# Patient Record
Sex: Female | Born: 1985 | Race: Black or African American | Hispanic: No | Marital: Single | Smoking: Former smoker
Health system: Southern US, Community
[De-identification: ages and names within clinical notes are randomized; demographics above are authoritative.]

## PROBLEM LIST (undated history)

## (undated) DIAGNOSIS — G43909 Migraine, unspecified, not intractable, without status migrainosus: Secondary | ICD-10-CM

---
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
## 2015-08-27 ENCOUNTER — Emergency Department (HOSPITAL_BASED_OUTPATIENT_CLINIC_OR_DEPARTMENT_OTHER): Payer: No Typology Code available for payment source

## 2015-08-27 ENCOUNTER — Encounter (HOSPITAL_BASED_OUTPATIENT_CLINIC_OR_DEPARTMENT_OTHER): Payer: Self-pay | Admitting: Emergency Medicine

## 2015-08-27 ENCOUNTER — Emergency Department (HOSPITAL_BASED_OUTPATIENT_CLINIC_OR_DEPARTMENT_OTHER)
Admission: EM | Admit: 2015-08-27 | Discharge: 2015-08-27 | Disposition: A | Discharge status: Home / Self Care | Payer: No Typology Code available for payment source | Attending: Emergency Medicine | Admitting: Emergency Medicine

## 2015-08-27 DIAGNOSIS — T07XXXA Unspecified multiple injuries, initial encounter: Secondary | ICD-10-CM

## 2015-08-27 DIAGNOSIS — Y92410 Unspecified street and highway as the place of occurrence of the external cause: Secondary | ICD-10-CM | POA: Insufficient documentation

## 2015-08-27 DIAGNOSIS — S2001XA Contusion of right breast, initial encounter: Secondary | ICD-10-CM | POA: Diagnosis not present

## 2015-08-27 DIAGNOSIS — S4991XA Unspecified injury of right shoulder and upper arm, initial encounter: Secondary | ICD-10-CM | POA: Insufficient documentation

## 2015-08-27 DIAGNOSIS — S299XXA Unspecified injury of thorax, initial encounter: Secondary | ICD-10-CM | POA: Insufficient documentation

## 2015-08-27 DIAGNOSIS — Y998 Other external cause status: Secondary | ICD-10-CM | POA: Insufficient documentation

## 2015-08-27 DIAGNOSIS — Z720 Tobacco use: Secondary | ICD-10-CM | POA: Insufficient documentation

## 2015-08-27 DIAGNOSIS — S301XXA Contusion of abdominal wall, initial encounter: Secondary | ICD-10-CM | POA: Insufficient documentation

## 2015-08-27 DIAGNOSIS — Z8679 Personal history of other diseases of the circulatory system: Secondary | ICD-10-CM | POA: Insufficient documentation

## 2015-08-27 DIAGNOSIS — S8001XA Contusion of right knee, initial encounter: Secondary | ICD-10-CM | POA: Insufficient documentation

## 2015-08-27 DIAGNOSIS — S4992XA Unspecified injury of left shoulder and upper arm, initial encounter: Secondary | ICD-10-CM | POA: Diagnosis not present

## 2015-08-27 DIAGNOSIS — S8002XA Contusion of left knee, initial encounter: Secondary | ICD-10-CM | POA: Diagnosis not present

## 2015-08-27 DIAGNOSIS — Y9389 Activity, other specified: Secondary | ICD-10-CM | POA: Diagnosis not present

## 2015-08-27 DIAGNOSIS — S3991XA Unspecified injury of abdomen, initial encounter: Secondary | ICD-10-CM | POA: Diagnosis present

## 2015-08-27 DIAGNOSIS — Z3202 Encounter for pregnancy test, result negative: Secondary | ICD-10-CM | POA: Insufficient documentation

## 2015-08-27 HISTORY — DX: Migraine, unspecified, not intractable, without status migrainosus: G43.909

## 2015-08-27 LAB — URINALYSIS, ROUTINE W REFLEX MICROSCOPIC
Bilirubin Urine: NEGATIVE
Glucose, UA: NEGATIVE mg/dL
Ketones, ur: NEGATIVE mg/dL
Leukocytes, UA: NEGATIVE
Nitrite: NEGATIVE
Protein, ur: NEGATIVE mg/dL
Specific Gravity, Urine: 1.007 (ref 1.005–1.030)
Urobilinogen, UA: 0.2 mg/dL (ref 0.0–1.0)
pH: 7 (ref 5.0–8.0)

## 2015-08-27 LAB — BASIC METABOLIC PANEL
Anion gap: 8 (ref 5–15)
BUN: 6 mg/dL (ref 6–20)
CO2: 27 mmol/L (ref 22–32)
Calcium: 9.4 mg/dL (ref 8.9–10.3)
Chloride: 105 mmol/L (ref 101–111)
Creatinine, Ser: 0.67 mg/dL (ref 0.44–1.00)
GFR calc Af Amer: >60 mL/min (ref >60)
GFR calc non Af Amer: >60 mL/min (ref >60)
Glucose, Bld: 112 mg/dL — ABNORMAL HIGH (ref 65–99)
Potassium: 3.3 mmol/L — ABNORMAL LOW (ref 3.5–5.1)
Sodium: 140 mmol/L (ref 135–145)

## 2015-08-27 LAB — URINE MICROSCOPIC-ADD ON

## 2015-08-27 LAB — PREGNANCY, URINE: PREG TEST UR: NEGATIVE

## 2015-08-27 MED ORDER — IOHEXOL 300 MG/ML  SOLN
100.0000 mL | Freq: Once | INTRAMUSCULAR | Status: AC | PRN
  Administered 2015-08-27: 100 mL via INTRAVENOUS

## 2015-08-27 MED ORDER — IBUPROFEN 800 MG PO TABS: 800.0000 mg | ORAL_TABLET | Freq: Three times a day (TID) | ORAL | Status: DC
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
## 2015-08-27 NOTE — ED Notes (Signed)
Both knees appear swollen at this time
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
## 2015-08-27 NOTE — ED Notes (Signed)
No changes, alert, NAD, calm, steady gait, dressed self, "ready to go", denies further questions or needs, tolerating PO fluids, VSS, out with sister.
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
## 2015-08-27 NOTE — ED Notes (Signed)
Pt not in room. Pt in b/r. Family remains in room.

## 2015-08-27 NOTE — ED Notes (Signed)
MD at bedside. 
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
## 2015-08-27 NOTE — ED Notes (Signed)
Ginger Ale given to patient. 

## 2015-10-14 ENCOUNTER — Encounter: Payer: Self-pay | Admitting: *Deleted

## 2015-11-09 ENCOUNTER — Encounter: Payer: Self-pay | Admitting: Obstetrics & Gynecology

## 2015-12-01 ENCOUNTER — Encounter: Payer: Self-pay | Admitting: Obstetrics and Gynecology
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
## 2017-02-28 DIAGNOSIS — F419 Anxiety disorder, unspecified: Secondary | ICD-10-CM | POA: Insufficient documentation

## 2017-02-28 DIAGNOSIS — Z87891 Personal history of nicotine dependence: Secondary | ICD-10-CM | POA: Insufficient documentation

## 2017-02-28 NOTE — ED Triage Notes (Signed)
Patient is anxious and unable to sleep. Pt reports she is anxious about her turning 31 soon. Pt has made some lifestyle changes of quitting smoking, quit drinking coffee, starting a regular exercise plan in the last two weeks. Pts checked her BP and it was elevated at home.

## 2017-03-01 ENCOUNTER — Emergency Department (HOSPITAL_COMMUNITY)
Admission: EM | Admit: 2017-03-01 | Discharge: 2017-03-01 | Disposition: A | Discharge status: Home / Self Care | Payer: Self-pay | Attending: Emergency Medicine | Admitting: Emergency Medicine

## 2017-03-01 ENCOUNTER — Encounter (HOSPITAL_COMMUNITY): Payer: Self-pay | Admitting: Family Medicine

## 2017-03-01 DIAGNOSIS — F419 Anxiety disorder, unspecified: Secondary | ICD-10-CM

## 2017-03-01 LAB — I-STAT CHEM 8, ED
BUN: 10 mg/dL (ref 6–20)
CHLORIDE: 102 mmol/L (ref 101–111)
CREATININE: 0.7 mg/dL (ref 0.44–1.00)
Calcium, Ion: 1.23 mmol/L (ref 1.15–1.40)
GLUCOSE: 110 mg/dL — AB (ref 65–99)
HCT: 43 % (ref 36.0–46.0)
Hemoglobin: 14.6 g/dL (ref 12.0–15.0)
POTASSIUM: 3.8 mmol/L (ref 3.5–5.1)
Sodium: 139 mmol/L (ref 135–145)
TCO2: 27 mmol/L (ref 0–100)

## 2017-03-01 LAB — PREGNANCY, URINE: Preg Test, Ur: NEGATIVE

## 2017-03-01 MED ORDER — LORAZEPAM 0.5 MG PO TABS: 0.5000 mg | ORAL_TABLET | Freq: Three times a day (TID) | ORAL | 0 refills | Status: AC | PRN

## 2017-03-01 MED ORDER — LORAZEPAM 1 MG PO TABS
1.0000 mg | ORAL_TABLET | Freq: Once | ORAL | Status: AC
  Administered 2017-03-01: 1 mg via ORAL
  Filled 2017-03-01: qty 1

## 2017-03-01 NOTE — Discharge Instructions (Signed)
Follow up with a primary care provider of your choice for further evaluation and management of likely anxiety. Ativan as directed as and if needed.

## 2017-03-01 NOTE — ED Provider Notes (Signed)
[facility]-EMERGENCY DEPT Provider Note   CSN: [id number] Arrival date & time: 02/28/17  2326   By signing my name below, I, Clarisse GougeXavier Herndon, attest that this documentation has been prepared under the direction and in the presence of Elpidio AnisShari Alexie Lanni, PA-C. Electronically Signed: Clarisse GougeXavier Herndon, Scribe. 03/01/17. 12:43 AM.   History   Chief Complaint Chief Complaint  Patient presents with  . Anxiety   The history is provided by the patient and medical records. No language interpreter was used.    HPI Comments: Paula Howard is a 31 y.o. female who presents to the Emergency Department complaining of anxiety x 1 week. She notes associated difficulty going to sleep and staying sleep, warm sensation disturbing sleep, mild heart palpitations, SOB, fatigue, crying spells and heightened blood pressure. She states her symptoms are episodic and last anywhere from minutes to hours. Pt states she recently made a lot of life changes, including quitting caffeine, marijuana and tobacco. She expresses concern for stress over her upcoming 31th birthday. No Hx of diabetes, HTN or daily medications noted. Pt denies possibility of pregnancy, diaphoresis, nausea, SI/HI, hallucinations or Hx of drug abuse.  Past Medical History:  Diagnosis Date  . Migraines     There are no active problems to display for this patient.   History reviewed. No pertinent surgical history.  OB History    No data available       Home Medications    Prior to Admission medications   Medication Sig Start Date End Date Taking? Authorizing Provider  ibuprofen (ADVIL,MOTRIN) 800 MG tablet Take 1 tablet (800 mg total) by mouth 3 (three) times daily. 08/27/15   Glynn OctaveStephen Rancour, MD    Family History History reviewed. No pertinent family history.  Social History Social History  Substance Use Topics  . Smoking status: Former Smoker    Quit date: 02/20/2017  . Smokeless tobacco: Never Used  . Alcohol use Yes     Allergies     Patient has no known allergies.   Review of Systems Review of Systems  Constitutional: Positive for fatigue.       +warm sensation  Respiratory: Negative for shortness of breath.   Cardiovascular: Positive for palpitations.  Gastrointestinal: Negative for abdominal pain, nausea and vomiting.  Genitourinary: Negative for menstrual problem.  Musculoskeletal: Negative.   Skin: Negative.   Neurological: Negative for weakness and headaches.  Psychiatric/Behavioral: Positive for dysphoric mood and sleep disturbance. Negative for hallucinations and suicidal ideas. The patient is nervous/anxious.      Physical Exam Updated Vital Signs BP (!) 142/104   Pulse (!) 110   Temp 98 F (36.7 C) (Oral)   Resp 19   Ht 5\' 3"  (1.6 m)   Wt 189 lb 2 oz (85.8 kg)   SpO2 100%   BMI 33.50 kg/m   Physical Exam  Constitutional: She is oriented to person, place, and time. She appears well-developed and well-nourished. No distress.  HENT:  Head: Normocephalic and atraumatic.  Eyes: EOM are normal. Pupils are equal, round, and reactive to light.  Neck: Normal range of motion. Neck supple.  Cardiovascular: Normal rate, regular rhythm, normal heart sounds and intact distal pulses.  Exam reveals no gallop and no friction rub.   No murmur heard. Pulmonary/Chest: Effort normal and breath sounds normal. No respiratory distress. She has no wheezes. She has no rales.  Abdominal: Soft. Bowel sounds are normal. She exhibits no distension and no mass. There is no tenderness. There is no guarding.  Musculoskeletal:  She exhibits no edema or tenderness.  Neurological: She is alert and oriented to person, place, and time.  Skin: Skin is warm and dry. She is not diaphoretic.  Psychiatric: She has a normal mood and affect. Her behavior is normal.  Nursing note and vitals reviewed.    ED Treatments / Results  DIAGNOSTIC STUDIES: Oxygen Saturation is 100% on RA, normal by my interpretation.    COORDINATION OF  CARE: 12:39 AM Discussed treatment plan with pt at bedside and pt agreed to plan. Will order blood work and medication.  Labs (all labs ordered are listed, but only abnormal results are displayed) Labs Reviewed  PREGNANCY, URINE    EKG  EKG Interpretation None       Radiology No results found.  Procedures Procedures (including critical care time)  Medications Ordered in ED Medications - No data to display   Initial Impression / Assessment and Plan / ED Course  I have reviewed the triage vital signs and the nursing notes.  Pertinent labs & imaging results that were available during my care of the patient were reviewed by me and considered in my medical decision making (see chart for details).   Patient presents with symptoms of anxiety, sleep disturbance, emotional volatility. Symptoms have been coming on for several weeks but worse and more constant in the last week. She has identified turning 31 years old in May as a trigger for emotional upset and feels her symptoms stem from this. She becomes tearful immediately on discussing her birthday.  Symptoms likely result of anxiety. Strongly recommended PCP follow up in additional counseling. Will give limited number of Ativan and outpatient resources.    I personally performed the services described in this documentation, which was scribed in my presence. The recorded information has been reviewed and is accurate.     Final Clinical Impressions(s) / ED Diagnoses   Final diagnoses:  None   1. Anxiety  New Prescriptions New Prescriptions   No medications on file     Elpidio Anis, Cordelia Poche 03/01/17 0059    Paula Libra, MD 03/01/17 [phone]

## 2017-03-01 NOTE — ED Notes (Signed)
Patient tolerated ativan without complications. VSS. She verbalized understanding of discharge information and follow-up. D/C home with sister.

## 2017-03-07 ENCOUNTER — Encounter: Payer: Self-pay | Admitting: Physician Assistant

## 2017-03-07 ENCOUNTER — Ambulatory Visit: Payer: Self-pay | Attending: Internal Medicine | Admitting: Physician Assistant

## 2017-03-07 VITALS — BP 142/89 | HR 100 | Temp 98.2°F | Resp 16 | Wt 189.6 lb

## 2017-03-07 DIAGNOSIS — R030 Elevated blood-pressure reading, without diagnosis of hypertension: Secondary | ICD-10-CM | POA: Insufficient documentation

## 2017-03-07 DIAGNOSIS — F1290 Cannabis use, unspecified, uncomplicated: Secondary | ICD-10-CM | POA: Insufficient documentation

## 2017-03-07 DIAGNOSIS — R739 Hyperglycemia, unspecified: Secondary | ICD-10-CM | POA: Insufficient documentation

## 2017-03-07 DIAGNOSIS — Z79899 Other long term (current) drug therapy: Secondary | ICD-10-CM | POA: Insufficient documentation

## 2017-03-07 DIAGNOSIS — F419 Anxiety disorder, unspecified: Secondary | ICD-10-CM

## 2017-03-07 DIAGNOSIS — F064 Anxiety disorder due to known physiological condition: Secondary | ICD-10-CM | POA: Insufficient documentation

## 2017-03-07 DIAGNOSIS — F17210 Nicotine dependence, cigarettes, uncomplicated: Secondary | ICD-10-CM | POA: Insufficient documentation

## 2017-03-07 NOTE — Progress Notes (Signed)
Paula Howard, is a 31 y.o. female  ZOX:[id number]  [id number]:[id number]  DOB - 24-Sep-1986  Subjective:  Chief Complaint and HPI: Paula Howard is a 31 y.o. female here today to establish care and for a follow up visit afetr being seen in the ED 03/01/2017 for anxiety.  She quit smoking marijuana, cigarettes, cut back on caffeine, and started making dietary changes 2 weeks ago today.  Since then her BP and anxiety have been elevated.  She has also felt very emotional.  She has been a daily marijuana smoker for about 8 years and has smoked cigarettes for about 10 years-about 5/day.  She is about to turn 31 and realized she wants to change her life.  She didn't like the lifestyle she was living and wants to be different.  She doesn't think she has ever had BP problems.  She has never had diabetes.  ED/Hospital notes reviewed.  She was prescribed ativan prn anxiety at the ED visit Social History: +some alcohol Family history:  No DM, no htn  ROS:   Constitutional:  No f/c, No night sweats, No unexplained weight loss. EENT:  No vision changes, No blurry vision, No hearing changes. No mouth, throat, or ear problems.  Respiratory: No cough, No SOB Cardiac: No CP, no palpitations GI:  No abd pain, No N/V/D. GU: No Urinary s/sx Musculoskeletal: No joint pain Neuro: No headache, no dizziness, no motor weakness.  Skin: No rash Endocrine:  No polydipsia. No polyuria.  Psych: Denies SI/HI.  No depression/mania/psychosis.  +anxiety  No problems updated.  ALLERGIES: No Known Allergies  PAST MEDICAL HISTORY: Past Medical History:  Diagnosis Date  . Migraines     MEDICATIONS AT HOME: Prior to Admission medications   Medication Sig Start Date End Date Taking? Authorizing Provider  LORazepam (ATIVAN) 0.5 MG tablet Take 1 tablet (0.5 mg total) by mouth 3 (three) times daily as needed for anxiety. Patient not taking: Reported on 03/07/2017 03/01/17   Elpidio Anis, PA-C     Objective:  EXAM:     Vitals:   03/07/17 1542  BP: (!) 142/89  Pulse: 100  Resp: 16  Temp: 98.2 F (36.8 C)  TempSrc: Oral  SpO2: 100%  Weight: 189 lb 9.6 oz (86 kg)    General appearance : A&OX3. NAD. Non-toxic-appearing HEENT: Atraumatic and Normocephalic.  PERRLA. EOM intact.  Neck: supple, no JVD. No cervical lymphadenopathy. No thyromegaly Chest/Lungs:  Breathing-non-labored, Good air entry bilaterally, breath sounds normal without rales, rhonchi, or wheezing  CVS: S1 S2 regular, no murmurs, gallops, rubs  Extremities: Bilateral Lower Ext shows no edema, both legs are warm to touch with = pulse throughout Neurology:  CN II-XII grossly intact, Non focal.   Psych:  TP linear. J/I WNL. Normal speech. Appropriate eye contact and affect.  Skin:  No Rash  Data Review No results found for: HGBA1C   Assessment & Plan   1. Elevated BP without diagnosis of hypertension Check your blood pressure 3-4 times/week and record and bring to next visit.  I think her BP is likely up due to sudden lifestyle changes resulting in increased anxiety, etc.  Will follow.  2. Anxiety Due to recent cessation of marijuana, cigarettes, and dietary changes.  Deep breathing techniques.  12 step meeting schedules given and I encouraged her attendance.  She is highly motivated for change. Vitamin D 2000 units daily.  3. Hyperglycemia Check A1C at f/up-sugars have been minimally elevated  I spent 45 mins face to face counseling  on continued sobriety/smoking cessation/offering support resources, etc.  She can try melatonin to help with sleep.  Avoid ativan, alcohol or any substances.   Patient have been counseled extensively about nutrition and exercise  Return in about 3 weeks (around 03/28/2017) for  f/up with me for anxiety.  The patient was given clear instructions to go to ER or return to medical center if symptoms don't improve, worsen or new problems develop. The patient verbalized understanding. The patient was  told to call to get lab results if they haven't heard anything in the next week.     Georgian CoAngela McClung, PA-C [facility] [place], [place] [phone]   03/07/2017, 4:32 PMPatient ID: [name], female   DOB: September 13, 1986, 31 y.o.   MRN: [id number]

## 2017-03-07 NOTE — Patient Instructions (Signed)
Melatonin 3-6mg  1 hour before bedtime for sleep if needed Vitamin D 2000 units daily  Check your blood pressure 3-4 times/week and record and bring to next visit Get financial packet at check out

## 2017-03-25 ENCOUNTER — Encounter: Payer: Self-pay | Admitting: Obstetrics & Gynecology

## 2017-03-28 ENCOUNTER — Ambulatory Visit: Payer: Self-pay | Admitting: Family Medicine

## 2017-03-28 NOTE — Progress Notes (Deleted)
   Subjective:  Patient ID: Paula Howard, female    DOB: Mar 14, 1986  Age: 32 y.o. MRN: [id number]  CC: No chief complaint on file.   HPI Paula Howard presents for   Anxiety:  HTN:  Elevated BP's:    Outpatient Medications Prior to Visit  Medication Sig Dispense Refill  . LORazepam (ATIVAN) 0.5 MG tablet Take 1 tablet (0.5 mg total) by mouth 3 (three) times daily as needed for anxiety. (Patient not taking: Reported on 03/07/2017) 6 tablet 0   No facility-administered medications prior to visit.     ROS Review of Systems      Objective:  There were no vitals taken for this visit.  BP/Weight 03/07/2017 03/01/2017 08/27/2015  Systolic BP 142 137 140  Diastolic BP 89 74 84  Wt. (Lbs) 189.6 189.13 195  BMI 33.59 33.5 34.55     Physical Exam   Assessment & Plan:   Problem List Items Addressed This Visit    None      No orders of the defined types were placed in this encounter.   Follow-up: No Follow-up on file.   Lizbeth Bark FNP

## 2017-04-09 ENCOUNTER — Ambulatory Visit: Payer: Self-pay | Attending: Family Medicine | Admitting: Family Medicine

## 2017-04-09 VITALS — BP 128/77 | HR 92 | Temp 98.3°F | Resp 18 | Ht 63.0 in | Wt 193.0 lb

## 2017-04-09 DIAGNOSIS — F4322 Adjustment disorder with anxiety: Secondary | ICD-10-CM | POA: Insufficient documentation

## 2017-04-09 DIAGNOSIS — Z09 Encounter for follow-up examination after completed treatment for conditions other than malignant neoplasm: Secondary | ICD-10-CM

## 2017-04-09 DIAGNOSIS — F4320 Adjustment disorder, unspecified: Secondary | ICD-10-CM

## 2017-04-09 LAB — POCT GLYCOSYLATED HEMOGLOBIN (HGB A1C): Hemoglobin A1C: 5.4

## 2017-04-09 NOTE — Progress Notes (Signed)
Subjective:  Patient ID: Paula Howard, female    DOB: Jul 24, 1986  Age: 31 y.o. MRN: [id number]  CC: No chief complaint on file.   HPI LAELLE BRIDGETT presents for   Anxiety: Reports improvement of symptoms since last visit. Denies any marijuana or alcohol use at this time. Reports the last time she used marijuana was last month. Reports social drinking, denies drinking every week or month. Reports getting between 6-7 hours of sleep per night. Denies any SI/HI. She expresses wanting to change her lifestyle and wants to become healthier. Denies any safety concerns.    Outpatient Medications Prior to Visit  Medication Sig Dispense Refill  . LORazepam (ATIVAN) 0.5 MG tablet Take 1 tablet (0.5 mg total) by mouth 3 (three) times daily as needed for anxiety. (Patient not taking: Reported on 03/07/2017) 6 tablet 0   No facility-administered medications prior to visit.     ROS Review of Systems  Constitutional: Negative.   Respiratory: Negative.   Cardiovascular: Negative.   Neurological: Negative.   Psychiatric/Behavioral: The patient is nervous/anxious.     Objective:  BP 128/77 (BP Location: Left Arm, Patient Position: Sitting, Cuff Size: Normal)   Pulse 92   Temp 98.3 F (36.8 C) (Oral)   Resp 18   Ht  (1.6 m)   Wt 193 lb (87.5 kg)   SpO2 99%   BMI 34.19 kg/m   BP/Weight 04/09/2017 03/07/2017 03/01/2017  Systolic BP 128 142 137  Diastolic BP 77 89 74  Wt. (Lbs) 193 189.6 189.13  BMI 34.19 33.59 33.5   Physical Exam  Cardiovascular: Normal rate, regular rhythm, normal heart sounds and intact distal pulses.   Pulmonary/Chest: Effort normal and breath sounds normal.  Abdominal: Soft. Bowel sounds are normal.  Skin: Skin is warm and dry.  Psychiatric: Her speech is normal. Her affect is labile. She expresses no homicidal and no suicidal ideation. She expresses no suicidal plans and no homicidal plans.  Tearful  Nursing note and vitals reviewed.   Depression  screen [facility] 2/9 04/09/2017 03/07/2017 03/07/2017  Decreased Interest 0 0 0  Down, Depressed, Hopeless 0 0 0  PHQ - 2 Score 0 0 0  Altered sleeping 1 2 -  Tired, decreased energy 1 1 -  Change in appetite 0 1 -  Feeling bad or failure about yourself  0 0 -  Trouble concentrating 0 0 -  Moving slowly or fidgety/restless 0 0 -  Suicidal thoughts 0 0 -  PHQ-9 Score 2 4 -    GAD 7 : Generalized Anxiety Score 04/09/2017 03/07/2017 03/07/2017  Nervous, Anxious, on Edge Control/stop worrying 0 2 2  Worry too much - different things 0 3 3  Trouble relaxing 0 3 3  Restless 0 0 0  Easily annoyed or irritable 0 1 1  Afraid - awful might happen 0 1 1  Total GAD 7 Score Assessment & Plan:   Problem List Items Addressed This Visit    None    Visit Diagnoses    Adjustment disorder, unspecified type    -  Primary   -Provided counseling resources.   -Tips for goal setting were discussed.    Relevant Orders   Ambulatory referral to Psychology   Follow up       Relevant Orders   POCT glycosylated hemoglobin (Hb A1C) (Completed)       Follow-up: Return in about 8 weeks (around 06/04/2017) for  Adjustment disorder.   Lizbeth Bark FNP

## 2017-04-09 NOTE — Progress Notes (Signed)
Patient is here for f/up   Patient denies pain for today  

## 2017-04-09 NOTE — Patient Instructions (Addendum)
Follow up with counseling resources.  Exercising to Stay Healthy Exercising regularly is important. It has many health benefits, such as:  Improving your overall fitness, flexibility, and endurance.  Increasing your bone density.  Helping with weight control.  Decreasing your body fat.  Increasing your muscle strength.  Reducing stress and tension.  Improving your overall health. In order to become healthy and stay healthy, it is recommended that you do moderate-intensity and vigorous-intensity exercise. You can tell that you are exercising at a moderate intensity if you have a higher heart rate and faster breathing, but you are still able to hold a conversation. You can tell that you are exercising at a vigorous intensity if you are breathing much harder and faster and cannot hold a conversation while exercising. How often should I exercise? Choose an activity that you enjoy and set realistic goals. Your health care provider can help you to make an activity plan that works for you. Exercise regularly as directed by your health care provider. This may include:  Doing resistance training twice each week, such as:  Push-ups.  Sit-ups.  Lifting weights.  Using resistance bands.  Doing a given intensity of exercise for a given amount of time. Choose from these options:  150 minutes of moderate-intensity exercise every week.  75 minutes of vigorous-intensity exercise every week.  A mix of moderate-intensity and vigorous-intensity exercise every week. Children, pregnant women, people who are out of shape, people who are overweight, and older adults may need to consult a health care provider for individual recommendations. If you have any sort of medical condition, be sure to consult your health care provider before starting a new exercise program. What are some exercise ideas? Some moderate-intensity exercise ideas include:  Walking at a rate of 1 mile in 15  minutes.  Biking.  Hiking.  Golfing.  Dancing. Some vigorous-intensity exercise ideas include:  Walking at a rate of at least 4.5 miles per hour.  Jogging or running at a rate of 5 miles per hour.  Biking at a rate of at least 10 miles per hour.  Lap swimming.  Roller-skating or in-line skating.  Cross-country skiing.  Vigorous competitive sports, such as football, basketball, and soccer.  Jumping rope.  Aerobic dancing. What are some everyday activities that can help me to get exercise?  Yard work, such as:  Child psychotherapist.  Raking and bagging leaves.  Washing and waxing your car.  Pushing a stroller.  Shoveling snow.  Gardening.  Washing windows or floors. How can I be more active in my day-to-day activities?  Use the stairs instead of the elevator.  Take a walk during your lunch break.  If you drive, park your car farther away from work or school.  If you take public transportation, get off one stop early and walk the rest of the way.  Make all of your phone calls while standing up and walking around.  Get up, stretch, and walk around every 30 minutes throughout the day. What guidelines should I follow while exercising?  Do not exercise so much that you hurt yourself, feel dizzy, or get very short of breath.  Consult your health care provider before starting a new exercise program.  Wear comfortable clothes and shoes with good support.  Drink plenty of water while you exercise to prevent dehydration or heat stroke. Body water is lost during exercise and must be replaced.  Work out until you breathe faster and your heart beats faster. This information  is not intended to replace advice given to you by your health care provider. Make sure you discuss any questions you have with your health care provider. Document Released: 01/05/2011 Document Revised: 05/10/2016 Document Reviewed: 05/06/2014 Elsevier Interactive Patient Education  2017  [employer].   Eating Healthy on a Budget There are many ways to save money at the grocery store and continue to eat healthy. You can be successful if you plan your meals according to your budget, purchase according to your budget and grocery list, and prepare food yourself. How can I buy more food on a limited budget? Plan   Plan meals and snacks according to a grocery list and budget you create.  Look for recipes where you can cook once and make enough food for two meals.  Include meals that will "stretch" more expensive foods such as stews, casseroles, and stir-fry dishes.  Make a grocery list and make sure to bring it with you to the store. If you have a smart phone, you could use your phone to create your shopping list. Purchase   When grocery shopping, buy only the items on your grocery list and go only to the areas of the store that have the items on your list. Prepare   Some meal items can be prepared in advance. Pre-cook on days when you have extra time.  Make extra food (such as by doubling recipes) and freeze the extras in meal-sized containers or in individual portions for fast meals and snacks.  Use leftovers in your meal plan for the week.  Try some meatless meals or try "no cook" meals like salads.  When you come home from the grocery store, wash and prepare your fruits and vegetables so they are ready to use and eat. This will help reduce food waste. How can I buy more food on a limited budget? Try these tips the next time you go shopping:  [place] store brands or generic brands.  Use coupons only for foods and brands you normally buy. Avoid buying items you wouldn't normally buy simply because they are on sale.  Check online and in newspapers for weekly deals.  Buy healthy items from the bulk bins when available, such as herbs, spices, flours, pastas, nuts, and dried fruit.  Buy fruits and vegetables that are in season. Prices are usually lower on in-season  produce.  Compare and contrast different items. You can do this by looking at the unit price on the price tag. Use it to compare different brands and sizes to find out which item is the best deal.  Choose naturally low-cost healthy items, such as carrots, potatoes, apples, bananas, and oranges. Dried or canned beans are a low-cost protein source.  Buy in bulk and freeze extra food. Items you can buy in bulk include meats, fish, poultry, frozen fruits, and frozen vegetables.  Limit the purchase of prepared or "ready-to-eat" foods, such as pre-cut fruits and vegetables and pre-made salads.  If possible, shop around to discover which grocery store offers the best prices. Some stores charge much more than other stores for the same items.  Do not shop when you are hungry. If you shop while hungry, It may be hard to stick to your list and budget.  Stick to your list and resist impulse buys. Treat your list as your official plan for the week.  Buy a variety of vegetables and fruit by purchasing fresh, frozen, and canned items.  Look beyond eye level. Foods at eye level (adult  or child eye level) are more expensive. Look at the top and bottom shelves for deals.  Be efficient with your time when shopping. The more time you spend at the store, the more money you are likely to spend.  Consider other retailers such as dollar stores, larger [employer], local fruit and vegetable stands, and farmers markets. What are some tips for less expensive food substitutions? When choosing more expensive foods like meats and dairy, try these tips to save money:  Choose cheaper cuts of meat, such as bone-in chicken thighs and drumsticks instead skinless and boneless chicken. When you are ready to prepare the chicken, you can remove the skin yourself to make it healthier.  Choose lean meats like chicken or [place]. When choosing ground beef, make sure it is lean ground beef (92% lean, 8% fat). If you do buy a  fattier ground beef, drain the fat before eating.  Buy dried beans and peas, such as lentils, split peas, or kidney beans.  For seafood, choose canned tuna, salmon, or sardines.  Eggs are a low-cost source of protein.  Buy the larger tubs of yogurt instead of individual-sized containers.  Choose water instead of sodas and other sweetened beverages.  Skip buying chips, cookies, and other "junk food". These items are usually expensive, high in calories, and low in nutritional value. How can I prepare the foods I buy in the healthiest way? Practice these tips for cooking foods in the healthiest way to reduce excess fat and calorie intake:  Steam, saute, grill, or bake foods instead of frying them.  Make sure half your plate is filled with fruits or vegetables. Choose from fresh, frozen, or canned fruits and vegetables. If eating canned, remember to rinse them before eating. This will remove any excess salt added for packaging.  Trim all fat from meat before cooking. Remove the skin from chicken or [place].  Spoon off fat from meat dishes once they have been chilled in the refrigerator and the fat has hardened on the top.  Use skim milk, low-fat milk, or evaporated skim milk when making cream sauces, soups, or puddings.  Substitute low-fat yogurt, sour cream, or cottage cheese for sour cream and mayonnaise in dips and dressings.  Try lemon juice, herbs, or spices to season food instead of salt, butter, or margarine. This information is not intended to replace advice given to you by your health care provider. Make sure you discuss any questions you have with your health care provider. Document Released: 08/06/2014 Document Revised: 06/22/2016 Document Reviewed: 07/06/2014 Elsevier Interactive Patient Education  2017 [employer].

## 2017-06-11 ENCOUNTER — Ambulatory Visit: Payer: Self-pay | Admitting: Family Medicine

## 2019-07-21 ENCOUNTER — Encounter: Payer: Self-pay | Admitting: Internal Medicine

## 2019-08-06 ENCOUNTER — Encounter: Payer: Self-pay | Admitting: Internal Medicine

## 2019-08-10 ENCOUNTER — Encounter: Payer: Self-pay | Admitting: Endocrinology
